# Patient Record
Sex: Female | Born: 1975 | Race: Asian | Hispanic: No | Smoking: Current every day smoker
Health system: Southern US, Community
[De-identification: ages and names within clinical notes are randomized; demographics above are authoritative.]

## PROBLEM LIST (undated history)

## (undated) DIAGNOSIS — E785 Hyperlipidemia, unspecified: Secondary | ICD-10-CM

## (undated) DIAGNOSIS — E119 Type 2 diabetes mellitus without complications: Secondary | ICD-10-CM

## (undated) HISTORY — PX: GYNECOLOGIC CRYOSURGERY: SHX857

## (undated) HISTORY — DX: Hyperlipidemia, unspecified: E78.5

## (undated) HISTORY — PX: ABDOMINAL HYSTERECTOMY: SHX81

## (undated) HISTORY — PX: MOUTH SURGERY: SHX715

## (undated) HISTORY — DX: Type 2 diabetes mellitus without complications: E11.9

---

## 2000-11-14 ENCOUNTER — Other Ambulatory Visit: Admission: RE | Admit: 2000-11-14 | Discharge: 2000-11-14 | Payer: Self-pay | Admitting: Obstetrics

## 2001-03-18 ENCOUNTER — Encounter: Admission: RE | Admit: 2001-03-18 | Discharge: 2001-06-16 | Payer: Self-pay | Admitting: Internal Medicine

## 2001-05-06 ENCOUNTER — Encounter (HOSPITAL_COMMUNITY): Admission: RE | Admit: 2001-05-06 | Discharge: 2001-05-14 | Payer: Self-pay | Admitting: *Deleted

## 2001-05-11 ENCOUNTER — Inpatient Hospital Stay (HOSPITAL_COMMUNITY): Admission: AD | Admit: 2001-05-11 | Discharge: 2001-05-11 | Payer: Self-pay | Admitting: Obstetrics

## 2001-05-19 ENCOUNTER — Inpatient Hospital Stay (HOSPITAL_COMMUNITY): Admission: AD | Admit: 2001-05-19 | Discharge: 2001-05-22 | Payer: Self-pay | Admitting: Obstetrics

## 2001-05-19 ENCOUNTER — Encounter (INDEPENDENT_AMBULATORY_CARE_PROVIDER_SITE_OTHER): Payer: Self-pay

## 2003-09-21 ENCOUNTER — Emergency Department (HOSPITAL_COMMUNITY): Admission: EM | Admit: 2003-09-21 | Discharge: 2003-09-21 | Payer: Self-pay | Admitting: Emergency Medicine

## 2005-02-14 ENCOUNTER — Emergency Department (HOSPITAL_COMMUNITY): Admission: EM | Admit: 2005-02-14 | Discharge: 2005-02-14 | Payer: Self-pay | Admitting: Emergency Medicine

## 2008-02-05 ENCOUNTER — Emergency Department (HOSPITAL_COMMUNITY): Admission: EM | Admit: 2008-02-05 | Discharge: 2008-02-05 | Payer: Self-pay | Admitting: Emergency Medicine

## 2011-02-16 NOTE — H&P (Signed)
[facility]  Patient:    Alyssa Williams, Alyssa Williams Visit Number: [id number] MRN: [id number]          Service Type: OBS Location: 910A 9147 01 Attending Physician:  Venita Sheffield Adm. Date:  [id number]                           History and Physical  HISTORY:                      The patient is a 35 year old, gravida 3, para 0-1-1-1, [facility] May 30, 2001. She had a previous classical C-section at 25 weeks because of preeclampsia and she is now admitted contracting. The cervix was closed, vertex -2. She also desired sterilization. She had a negative group B strep. She had gestational diabetes controlled on diet. All her sugars, four times a day, were normal. She has been followed with nonstress tests twice weekly for the past two weeks. In triage, her blood pressures are 153/99, 146/97, 144/82, and 121/63. PIH labs were ordered.  PHYSICAL EXAMINATION:  GENERAL:                      Obese female in early labor.  HEENT:                        Negative.  LUNGS:                        Clear.  HEART:                        Regular rate and rhythm, no murmurs, no gallops.  ABDOMEN:                      Term size uterus. Fetal heart 140.  PELVIC:                       As described above.  EXTREMITIES:                  Negative.   DD:  05/19/01 TD:  05/19/01 Job: [id number]

## 2011-02-16 NOTE — Discharge Summary (Signed)
[facility]  Patient:    Alyssa Williams, Alyssa Williams Visit Number: [id number] MRN: [id number]          Service Type: OBS Location: 910A 9147 01 Attending Physician:  Venita Sheffield Dictated by:   Kathreen Cosier, M.D. Adm. Date:  05/19/2001 Disc. Date: 05/22/2001                             Discharge Summary  HOSPITAL COURSE:              The patient is a 35 year old, gravida 3, para 0-1-1-1, [facility] May 30, 2001. She had a previous classical C-section at 25 weeks because of preeclampsia and she was now admitted in early labor. The cervix was closed, vertex -2. She also desired sterilization. On admission, the patient had blood pressures of 153/99, 146/97. PIH labs were normal. She had a spinal and low transverse cesarean section. She had a female, Apgars of8/9, weighing 7 pounds 10 ounces on May 19, 2001. On admission her hemoglobin was 12.7. Postoperative hemoglobin was 11.6. The patient did well. Her vital signs were normal and she was discharged on the third postoperative day, ambulatory, on a regular diet, status post repeat low transverse cesarean section and tubal ligation at term.  DISCHARGE FOLLOWUP:           The patient is to see Dr. Gaynell Face in six weeks. ictated by:   Kathreen Cosier, M.D. Attending Physician:  Venita Sheffield DD:  05/22/01 TD:  05/22/01 Job: [id number]

## 2011-02-16 NOTE — Op Note (Signed)
[facility]  Patient:    Alyssa Williams, Alyssa Williams Visit Number: [id number] MRN: [id number]          Service Type: ANT Location: [facility]C Attending Physician:  Venita Sheffield Dictated by:   Kathreen Cosier, M.D. Proc. Date: 05/19/01 Admit Date:  05/06/2001 Discharge Date: 05/14/2001                             Operative Report  PREOPERATIVE DIAGNOSES:       Previous classical cesarean section, at term, in                               early labor, desires repeat, multiparity, tubal                               ligation.  POSTOPERATIVE DIAGNOSES:      Previous classical cesarean section, at term, in                               early labor, desires repeat, multiparity, tubal                               ligation.  OPERATION:                    Repeat cesarean section and tubal ligation.  SURGEON:                      Kathreen Cosier, M.D.  ANESTHESIA:                   Spinal, by Dr. Arby Barrette.  ESTIMATED BLOOD LOSS:         400 cc.  DESCRIPTION OF PROCEDURE:     The patient was placed on the operating table in supine position after the spinal administered. The abdomen was prepped and draped. The bladder was emptied by a Foley catheter. A transverse suprapubic incision was made through the old scar and carried down to the rectus fascia. The fascia was cleanly incised the length of the incision. The rectus muscles were retracted laterally and the peritoneum incised longitudinally. A transverse incision was made in the visceral peritoneum above the bladder and the bladder mobilized inferiorly. A transverse lower uterine incision was made. The fluid was slightly meconium stained. The baby was DeLee prior to delivery of the shoulders. The baby was a female, Apgars 8/9, weighing 7 pounds 10 ounces. The placenta was posterior, removed manually. The uterine cavity was cleaned with dry laps. The uterine incision was closed in one layer with  continuous suture of #1 chromic. Hemostasis was satisfactory. The bladder flap was reattached with 2-0 chromic. The uterus was well contracted. Tubes and ovaries were normal. The right tube was grasped in the mid portion by Babcock clamp. A 0 plain suture was placed in the mesosalpinx below the portion of tube in the clamp. This was tied and approximately one inch of tube transected. Hemostasis was satisfactory. The procedure was done in a similar fashion on the other side. Lap and sponge counts were correct. The abdomen was closed in layers: the peritoneum with continuous suture of 0 chromic, the fascia with continuous suture of 0 Dexon,  and the skin closed with subcuticular stitch of 3-0 plain. The patient tolerated the procedure well and was taken to the recovery room in good condition. Dictated by:   Kathreen Cosier, M.D. Attending Physician:  Venita Sheffield DD:  05/19/01 TD:  05/20/01 Job: [id number]

## 2017-05-15 ENCOUNTER — Ambulatory Visit
Admission: RE | Admit: 2017-05-15 | Discharge: 2017-05-15 | Disposition: A | Discharge status: Home / Self Care | Payer: Self-pay | Source: Ambulatory Visit | Attending: Nurse Practitioner | Admitting: Nurse Practitioner

## 2017-05-15 ENCOUNTER — Other Ambulatory Visit: Payer: Self-pay | Admitting: Nurse Practitioner

## 2017-05-15 DIAGNOSIS — R05 Cough: Secondary | ICD-10-CM

## 2017-05-15 DIAGNOSIS — R059 Cough, unspecified: Secondary | ICD-10-CM

## 2017-05-28 ENCOUNTER — Encounter: Payer: Medicaid Other | Attending: Nurse Practitioner | Admitting: *Deleted

## 2017-05-28 ENCOUNTER — Encounter: Payer: Self-pay | Admitting: *Deleted

## 2017-05-28 VITALS — BP 112/70 | Ht 62.0 in | Wt 174.7 lb

## 2017-05-28 DIAGNOSIS — Z713 Dietary counseling and surveillance: Secondary | ICD-10-CM | POA: Insufficient documentation

## 2017-05-28 DIAGNOSIS — E119 Type 2 diabetes mellitus without complications: Secondary | ICD-10-CM | POA: Insufficient documentation

## 2017-05-28 DIAGNOSIS — E669 Obesity, unspecified: Secondary | ICD-10-CM | POA: Insufficient documentation

## 2017-05-28 DIAGNOSIS — E1165 Type 2 diabetes mellitus with hyperglycemia: Secondary | ICD-10-CM

## 2017-05-28 NOTE — Progress Notes (Signed)
Diabetes Self-Management Education  Visit Type: First/Initial  Appt. Start Time: 1035 Appt. End Time: 1200  05/28/2017  [name], identified by name and date of birth, is a 41 y.o. female with a diagnosis of Diabetes: Type 2.   ASSESSMENT  Blood pressure 112/70, height 5\' 2"  (1.575 m), weight 174 lb 11.2 oz (79.2 kg). Body mass index is 31.95 kg/m.      Diabetes Self-Management Education - 05/28/17 1508      Visit Information   Visit Type First/Initial     Initial Visit   Diabetes Type Type 2   Are you currently following a meal plan? Yes   What type of meal plan do you follow? "avoid sugars and cutting breads, pastas, rice to minimum"   Are you taking your medications as prescribed? Yes  She did forget to take her insulin last night.    Date Diagnosed August 2018     Health Coping   How would you rate your overall health? Fair     Psychosocial Assessment   Patient Belief/Attitude about Diabetes Other (comment)  "depressed, sad, unhappy"   Self-care barriers None   Self-management support Doctor's office   Patient Concerns Nutrition/Meal planning;Medication;Monitoring;Healthy Lifestyle;Glycemic Control;Problem Solving;Weight Control   Special Needs None   Preferred Learning Style Visual;Hands on   Learning Readiness Change in progress   How often do you need to have someone help you when you read instructions, pamphlets, or other written materials from your doctor or pharmacy? 1 - Never   What is the last grade level you completed in school? college     Pre-Education Assessment   Patient understands the diabetes disease and treatment process. Needs Instruction   Patient understands incorporating nutritional management into lifestyle. Needs Instruction   Patient undertands incorporating physical activity into lifestyle. Needs Instruction   Patient understands using medications safely. Needs Instruction   Patient understands monitoring blood glucose,  interpreting and using results Needs Review   Patient understands prevention, detection, and treatment of acute complications. Needs Instruction   Patient understands prevention, detection, and treatment of chronic complications. Needs Instruction   Patient understands how to develop strategies to address psychosocial issues. Needs Instruction   Patient understands how to develop strategies to promote health/change behavior. Needs Instruction     Complications   Last HgB A1C per patient/outside source 12.1 %  05/15/17   How often do you check your blood sugar? 1-2 times/day  Provided Accu-Chek Guide meter as preferred with Medicaid and instructed on use. BG upon return demonstration was 453 mg/dL at 65:78 am - pt had Nectarine and diet soda before visit. Repeat BG on office meter was 406 mg/dL. 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    Fasting Blood glucose range (mg/dL) >469  Pt reports FBG's 200's mg/dL.    Have you had a dilated eye exam in the past 12 months? No   Have you had a dental exam in the past 12 months? No   Are you checking your feet? Yes   How many days per week are you checking your feet? 7     Dietary Intake   Breakfast fruit, grits, porridge from rice and eggs, oatmeal, toast with peanut butter   Lunch left overs   Snack (afternoon) nuts, popcorn, sunflower seeds   Dinner chicken, fish, pork with rice, cuccumbers, tomatoes, tuna fish lettuce wraps   Beverage(s) water, diet Ginger Ale, fruit juices  Exercise   Exercise Type Light (walking / raking leaves)   How many days per week to you exercise? 3   How many minutes per day do you exercise? 30   Total minutes per week of exercise 90     Patient Education   Previous Diabetes Education Yes (please comment)  Pt had Gestational Diabetes 15 years ago   Disease state  Definition of diabetes, type 1 and 2, and the diagnosis of diabetes;Factors that contribute  to the development of diabetes   Nutrition management  Role of diet in the treatment of diabetes and the relationship between the three main macronutrients and blood glucose level;Reviewed blood glucose goals for pre and post meals and how to evaluate the patients' food intake on their blood glucose level.   Physical activity and exercise  Role of exercise on diabetes management, blood pressure control and cardiac health.   Medications Taught/reviewed insulin injection, site rotation, insulin storage and needle disposal.;Reviewed patients medication for diabetes, action, purpose, timing of dose and side effects.   Monitoring Taught/evaluated SMBG meter.;Purpose and frequency of SMBG.;Taught/discussed recording of test results and interpretation of SMBG.;Identified appropriate SMBG and/or A1C goals.   Acute complications Taught treatment of hypoglycemia - the 15 rule.   Chronic complications Relationship between chronic complications and blood glucose control;Retinopathy and reason for yearly dilated eye exams   Psychosocial adjustment Role of stress on diabetes;Identified and addressed patients feelings and concerns about diabetes  Pt reports stress related to children.   Personal strategies to promote health Review risk of smoking and offered smoking cessation     Individualized Goals (developed by patient)   Reducing Risk Improve blood sugars Decrease medications Prevent diabetes complications Lose weight Lead a healthier lifestyle Become more fit     Outcomes   Expected Outcomes Demonstrated interest in learning. Expect positive outcomes   Future DMSE 4-6 wks      Individualized Plan for Diabetes Self-Management Training:   Learning Objective:  Patient will have a greater understanding of diabetes self-management. Patient education plan is to attend individual and/or group sessions per assessed needs and concerns.   Plan:   Patient Instructions  Check blood sugars 2 x day before  breakfast and 2 hrs after supper every day Bring blood sugar records to the next class Call your doctor for a prescription for:  1. Meter strips (type) Accu-Chek Guide  checking  2 times per day  2. Lancets (type) Accu-Chek FastClix checking  2     times per day Exercise: Continue walking  for   30  minutes  3 days a week and gradually increase to 30 minutes 5 x week  Nothing strenuous until blood sugars less than 250 Eat 3 meals day,   1-2  snacks a day Space meals 4-6 hours apart Avoid sugar sweetened drinks (juices) Quit smoking Make an eye doctor appointment once blood sugars stable Carry fast acting glucose and a snack at all times Rotate injection sites   Expected Outcomes:  Demonstrated interest in learning. Expect positive outcomes  Education material provided:  General Meal Planning Guidelines Simple Meal Plan Meter = Accu-Chek Guide Glucose tablets Symptoms, causes and treatments of Hypoglycemia  If problems or questions, patient to contact team via:  Sharion Settler, RN, CCM, CDE [phone]  Future DSME appointment: 4-6 wks  June 24, 2017 for Diabetes Class 1

## 2017-05-28 NOTE — Patient Instructions (Signed)
Check blood sugars 2 x day before breakfast and 2 hrs after supper every day Bring blood sugar records to the next class  Call your doctor for a prescription for:  1. Meter strips (type) Accu-Chek Guide  checking  2 times per day  2. Lancets (type) Accu-Chek FastClix checking  2     times per day  Exercise: Continue walking  for   30  minutes  3 days a week and gradually increase to 30 minutes 5 x week  Nothing strenuous until blood sugars less than 250  Eat 3 meals day,   1-2  snacks a day Space meals 4-6 hours apart Avoid sugar sweetened drinks (juices)  Quit smoking  Make an eye doctor appointment once blood sugars stable  Carry fast acting glucose and a snack at all times Rotate injection sites  Return for classes on:

## 2017-06-24 ENCOUNTER — Encounter: Payer: Self-pay | Admitting: Dietician

## 2017-06-24 NOTE — Progress Notes (Signed)
Pt cancelled diabetes class today; called pt and she wants to  check her work schedule and she will call us back to reschedule classes

## 2017-07-15 ENCOUNTER — Ambulatory Visit: Payer: Medicaid Other

## 2017-07-29 ENCOUNTER — Encounter: Payer: Self-pay | Admitting: *Deleted

## 2018-04-11 ENCOUNTER — Emergency Department: Payer: Medicaid Other

## 2018-04-11 ENCOUNTER — Encounter: Payer: Self-pay | Admitting: Emergency Medicine

## 2018-04-11 ENCOUNTER — Other Ambulatory Visit: Payer: Self-pay

## 2018-04-11 ENCOUNTER — Emergency Department
Admission: EM | Admit: 2018-04-11 | Discharge: 2018-04-11 | Disposition: A | Discharge status: Home / Self Care | Payer: Medicaid Other | Attending: Emergency Medicine | Admitting: Emergency Medicine

## 2018-04-11 DIAGNOSIS — F17210 Nicotine dependence, cigarettes, uncomplicated: Secondary | ICD-10-CM | POA: Insufficient documentation

## 2018-04-11 DIAGNOSIS — R319 Hematuria, unspecified: Secondary | ICD-10-CM | POA: Insufficient documentation

## 2018-04-11 DIAGNOSIS — Z794 Long term (current) use of insulin: Secondary | ICD-10-CM | POA: Insufficient documentation

## 2018-04-11 DIAGNOSIS — Z79899 Other long term (current) drug therapy: Secondary | ICD-10-CM | POA: Insufficient documentation

## 2018-04-11 DIAGNOSIS — E119 Type 2 diabetes mellitus without complications: Secondary | ICD-10-CM | POA: Insufficient documentation

## 2018-04-11 DIAGNOSIS — R109 Unspecified abdominal pain: Secondary | ICD-10-CM | POA: Insufficient documentation

## 2018-04-11 DIAGNOSIS — Z7982 Long term (current) use of aspirin: Secondary | ICD-10-CM | POA: Insufficient documentation

## 2018-04-11 DIAGNOSIS — R110 Nausea: Secondary | ICD-10-CM | POA: Insufficient documentation

## 2018-04-11 LAB — COMPREHENSIVE METABOLIC PANEL
ALT: 20 U/L (ref 0–44)
AST: 19 U/L (ref 15–41)
Albumin: 4.1 g/dL (ref 3.5–5.0)
Alkaline Phosphatase: 100 U/L (ref 38–126)
Anion gap: 11 (ref 5–15)
BUN: 20 mg/dL (ref 6–20)
CO2: 24 mmol/L (ref 22–32)
Calcium: 9.5 mg/dL (ref 8.9–10.3)
Chloride: 100 mmol/L (ref 98–111)
Creatinine, Ser: 0.77 mg/dL (ref 0.44–1.00)
GFR calc Af Amer: >60 mL/min (ref >60)
GFR calc non Af Amer: >60 mL/min (ref >60)
Glucose, Bld: 335 mg/dL — ABNORMAL HIGH (ref 70–99)
Potassium: 4 mmol/L (ref 3.5–5.1)
Sodium: 135 mmol/L (ref 135–145)
Total Bilirubin: 0.8 mg/dL (ref 0.3–1.2)
Total Protein: 7.7 g/dL (ref 6.5–8.1)

## 2018-04-11 LAB — LIPASE, BLOOD: Lipase: 41 U/L (ref 11–51)

## 2018-04-11 LAB — URINALYSIS, COMPLETE (UACMP) WITH MICROSCOPIC
Bacteria, UA: NONE SEEN
Bilirubin Urine: NEGATIVE
Glucose, UA: >=500 mg/dL — AB
Hgb urine dipstick: NEGATIVE
Ketones, ur: NEGATIVE mg/dL
Nitrite: NEGATIVE
Protein, ur: NEGATIVE mg/dL
Specific Gravity, Urine: 1.025 (ref 1.005–1.030)
pH: 5 (ref 5.0–8.0)

## 2018-04-11 LAB — CBC
HCT: 42.2 % (ref 35.0–47.0)
Hemoglobin: 14.9 g/dL (ref 12.0–16.0)
MCH: 30.7 pg (ref 26.0–34.0)
MCHC: 35.4 g/dL (ref 32.0–36.0)
MCV: 86.7 fL (ref 80.0–100.0)
Platelets: 357 10*3/uL (ref 150–440)
RBC: 4.87 MIL/uL (ref 3.80–5.20)
RDW: 12.5 % (ref 11.5–14.5)
WBC: 8.5 10*3/uL (ref 3.6–11.0)

## 2018-04-11 MED ORDER — IBUPROFEN 600 MG PO TABS: 600.0000 mg | ORAL_TABLET | Freq: Three times a day (TID) | ORAL | 0 refills | Status: AC | PRN

## 2018-04-11 MED ORDER — KETOROLAC TROMETHAMINE 30 MG/ML IJ SOLN
30.0000 mg | Freq: Once | INTRAMUSCULAR | Status: AC
  Administered 2018-04-11: 30 mg via INTRAMUSCULAR
  Filled 2018-04-11: qty 1

## 2018-04-11 MED ORDER — HYDROCODONE-ACETAMINOPHEN 5-325 MG PO TABS: 1.0000 | ORAL_TABLET | Freq: Four times a day (QID) | ORAL | 0 refills | Status: DC | PRN

## 2018-04-11 MED ORDER — HYDROCODONE-ACETAMINOPHEN 5-325 MG PO TABS
1.0000 | ORAL_TABLET | Freq: Once | ORAL | Status: AC
  Administered 2018-04-11: 1 via ORAL
  Filled 2018-04-11: qty 1

## 2018-04-11 MED ORDER — ONDANSETRON 4 MG PO TBDP: 4.0000 mg | ORAL_TABLET | Freq: Three times a day (TID) | ORAL | 0 refills | Status: AC | PRN

## 2018-04-11 NOTE — ED Triage Notes (Signed)
C/O lower abdominal pain and left flank pain x 2 days.. Pain worsened today.  Patient had urine checked at PCP which showed blood in urine.  Sent to ED for evaluation for kidney stone.

## 2018-04-11 NOTE — Discharge Instructions (Signed)
Fortunately today your blood work, your urinalysis, and your CT scan were reassuring.  It is still possible to have kidney stones that are too small to show up on CT scan.  Please take your pain medication as needed and return to the emergency department for any concerns particularly if you develop a fever of any kind.  It was a pleasure to take care of you today, and thank you for coming to our emergency department.  If you have any questions or concerns before leaving please ask the nurse to grab me and I'm more than happy to go through your aftercare instructions again.  If you were prescribed any opioid pain medication today such as Norco, Vicodin, Percocet, morphine, hydrocodone, or oxycodone please make sure you do not drive when you are taking this medication as it can alter your ability to drive safely.  If you have any concerns once you are home that you are not improving or are in fact getting worse before you can make it to your follow-up appointment, please do not hesitate to call [phone] and come back for further evaluation.  Merrily Brittle, MD  Results for orders placed or performed during the hospital encounter of 04/11/18  Lipase, blood  Result Value Ref Range   Lipase 41 11 - 51 U/L  Comprehensive metabolic panel  Result Value Ref Range   Sodium 135 135 - 145 mmol/L   Potassium 4.0 3.5 - 5.1 mmol/L   Chloride 100 98 - 111 mmol/L   CO2 24 22 - 32 mmol/L   Glucose, Bld 335 (H) 70 - 99 mg/dL   BUN 20 6 - 20 mg/dL   Creatinine, Ser 1.61 0.44 - 1.00 mg/dL   Calcium 9.5 8.9 - 09.6 mg/dL   Total Protein 7.7 6.5 - 8.1 g/dL   Albumin 4.1 3.5 - 5.0 g/dL   AST 19 15 - 41 U/L   ALT 20 0 - 44 U/L   Alkaline Phosphatase 100 38 - 126 U/L   Total Bilirubin 0.8 0.3 - 1.2 mg/dL   GFR calc non Af Amer >60 >60 mL/min   GFR calc Af Amer >60 >60 mL/min   Anion gap 11 5 - 15  CBC  Result Value Ref Range   WBC 8.5 3.6 - 11.0 K/uL   RBC 4.87 3.80 - 5.20 MIL/uL   Hemoglobin 14.9 12.0 - 16.0  g/dL   HCT 04.5 40.9 - 81.1 %   MCV 86.7 80.0 - 100.0 fL   MCH 30.7 26.0 - 34.0 pg   MCHC 35.4 32.0 - 36.0 g/dL   RDW 91.4 78.2 - 95.6 %   Platelets 357 150 - 440 K/uL  Urinalysis, Complete w Microscopic  Result Value Ref Range   Color, Urine STRAW (A) YELLOW   APPearance CLEAR (A) CLEAR   Specific Gravity, Urine 1.025 1.005 - 1.030   pH 5.0 5.0 - 8.0   Glucose, UA >=500 (A) NEGATIVE mg/dL   Hgb urine dipstick NEGATIVE NEGATIVE   Bilirubin Urine NEGATIVE NEGATIVE   Ketones, ur NEGATIVE NEGATIVE mg/dL   Protein, ur NEGATIVE NEGATIVE mg/dL   Nitrite NEGATIVE NEGATIVE   Leukocytes, UA TRACE (A) NEGATIVE   RBC / HPF 0-5 0 - 5 RBC/hpf   WBC, UA 6-10 0 - 5 WBC/hpf   Bacteria, UA NONE SEEN NONE SEEN   Squamous Epithelial / LPF 6-10 0 - 5   Ct Renal Stone Study  Result Date: 04/11/2018 CLINICAL DATA:  LEFT lower abdominal pain and LEFT  flank pain for 2 days. Blood in urine. EXAM: CT ABDOMEN AND PELVIS WITHOUT CONTRAST TECHNIQUE: Multidetector CT imaging of the abdomen and pelvis was performed following the standard protocol without IV contrast. COMPARISON:  None. FINDINGS: Lower chest: No acute abnormality. Hepatobiliary: No focal liver abnormality is seen. No gallstones, gallbladder wall thickening, or biliary dilatation. Pancreas: Unremarkable. No pancreatic ductal dilatation or surrounding inflammatory changes. Spleen: Normal in size without focal abnormality. Adrenals/Urinary Tract: Kidneys are unremarkable without mass, stone or hydronephrosis. No perinephric inflammation. No ureteral or bladder calculi identified. Bladder appears normal. Multiple calcified phleboliths within the lower pelvis. Stomach/Bowel: Bowel is normal in caliber. No bowel wall thickening or evidence of bowel wall inflammation. Appendix is normal. Stomach is unremarkable, partially decompressed. Vascular/Lymphatic: Aortic atherosclerosis. No enlarged abdominal or pelvic lymph nodes. Reproductive: Presumed hysterectomy.   No adnexal mass or free fluid. Other: No free fluid or abscess collection. No free intraperitoneal air. Musculoskeletal: No acute or suspicious osseous finding. Degenerative spondylitic changes at the lumbosacral junction, moderate in degree with osseous neural foramen narrowings. IMPRESSION: 1. No acute findings within the abdomen or pelvis. No renal or ureteral calculi identified. No hydronephrosis or perinephric inflammation. Bladder appears normal. No bowel obstruction or evidence of bowel wall inflammation. 2. Aortic atherosclerosis. 3. Degenerative disc disease at L5-S1, at least moderate in degree, with associated neural foramen narrowings bilaterally, RIGHT greater than LEFT. If any chronic low back pain or associated lower extremity radiculopathy, would consider nonemergent lumbar spine MRI to exclude associated nerve root impingement. Electronically Signed   By: Bary RichardStan  Maynard M.D.   On: 04/11/2018 16:24

## 2018-04-11 NOTE — ED Provider Notes (Signed)
[facility] Emergency Department Provider Note  ____________________________________________   First MD Initiated Contact with Patient 04/11/18 1601     (approximate)  I have reviewed the triage vital signs and the nursing notes.   HISTORY  Chief Complaint Abdominal Pain    HPI Alyssa Williams is a 42 y.o. female who self presents to the emergency department with lower abdominal and left flank pain for the past 2 days.  The patient is a Engineer, sitemedical assistant and today at work she had her urine dipped and there was blood in it so she was advised to come to the hospital for evaluation for renal colic.  The patient's pain was sudden onset moderate severity.  Nothing seems to make it better or worse.  She denies fevers or chills.  She denies dysuria frequency or hesitancy.  Nothing seems to make the symptoms better or worse.  She does have a history of poorly controlled diabetes mellitus.  She has never had a kidney stone before.  She does have a remote history of hysterectomy.    Past Medical History:  Diagnosis Date  . Diabetes mellitus without complication (HCC)   . Hyperlipidemia     There are no active problems to display for this patient.   Past Surgical History:  Procedure Laterality Date  . ABDOMINAL HYSTERECTOMY    . CESAREAN SECTION    . GYNECOLOGIC CRYOSURGERY    . MOUTH SURGERY      Prior to Admission medications   Medication Sig Start Date End Date Taking? Authorizing Provider  aspirin EC 81 MG tablet Take 81 mg by mouth daily.    [provider]  Cholecalciferol (PA VITAMIN D-3 GUMMY PO) Take 1 tablet by mouth daily.    [provider]  CINNAMON PO Take 1 capsule by mouth daily.    [provider]  Ginkgo Biloba 40 MG TABS Take 1 tablet by mouth daily.    [provider]  guaiFENesin (ROBITUSSIN) 100 MG/5ML SOLN Take 10 mLs by mouth every 4 (four) hours as needed for cough or to loosen phlegm.    [provider]  HYDROcodone-acetaminophen (NORCO) 5-325 MG tablet Take 1 tablet by mouth every 6 (six) hours as needed for up to 7 doses for severe pain. 04/11/18   Merrily Brittleifenbark, Jaely Silman, MD  ibuprofen (ADVIL,MOTRIN) 600 MG tablet Take 1 tablet (600 mg total) by mouth every 8 (eight) hours as needed. 04/11/18   Merrily Brittleifenbark, Aleria Maheu, MD  Insulin Glargine (TOUJEO SOLOSTAR) 300 UNIT/ML SOPN Inject 10 Units into the skin daily.    [provider]  metFORMIN (GLUMETZA) 500 MG (MOD) 24 hr tablet Take 500 mg by mouth 2 (two) times daily with a meal.    [provider]  MILK THISTLE PO Take 1 capsule by mouth daily.    [provider]  Multiple Vitamins-Minerals (WOMENS MULTI PO) Take 1 tablet by mouth daily.    [provider]  ondansetron (ZOFRAN ODT) 4 MG disintegrating tablet Take 1 tablet (4 mg total) by mouth every 8 (eight) hours as needed for nausea or vomiting. 04/11/18   Merrily Brittleifenbark, Marzetta Lanza, MD  POTASSIUM GLUCONATE PO Take 1 tablet by mouth daily.    [provider]    Allergies Demerol [meperidine] and Milk-related compounds  Family History  Problem Relation Age of Onset  . Diabetes Sister   . Diabetes Sister     Social History Social History   Tobacco Use  . Smoking status: Current Every  Day Smoker    Packs/day: 0.25    Years: 10.00    Pack years: 2.50    Types: Cigarettes  . Smokeless tobacco: Never Used  Substance Use Topics  . Alcohol use: Yes    Comment: occasionally - holidays  . Drug use: Not on file    Review of Systems Constitutional: No fever/chills Eyes: No visual changes. ENT: No sore throat. Cardiovascular: Denies chest pain. Respiratory: Denies shortness of breath. Gastrointestinal: Positive for abdominal pain.  Positive for nausea, no vomiting.  No diarrhea.  No constipation. Genitourinary: Negative for dysuria. Musculoskeletal: Positive for back pain. Skin: Negative for rash. Neurological: Negative for headaches, focal  weakness or numbness.   ____________________________________________   PHYSICAL EXAM:  VITAL SIGNS: ED Triage Vitals [04/11/18 1359]  Enc Vitals Group     BP 137/83     Pulse Rate 92     Resp 16     Temp 97.8 F (36.6 C)     Temp Source Oral     SpO2 100 %     Weight 168 lb (76.2 kg)     Height 5\' 2"  (1.575 m)     Head Circumference      Peak Flow      Pain Score 8     Pain Loc      Pain Edu?      Excl. in GC?     Constitutional: Alert and oriented x4 sitting up holding her left flank obviously uncomfortable Eyes: PERRL EOMI. Head: Atraumatic. Nose: No congestion/rhinnorhea. Mouth/Throat: No trismus Neck: No stridor.   Cardiovascular: Normal rate, regular rhythm. Grossly normal heart sounds.  Good peripheral circulation. Respiratory: Normal respiratory effort.  No retractions. Lungs CTAB and moving good air Gastrointestinal: Soft nontender no peritonitis no costovertebral tenderness Musculoskeletal: No lower extremity edema   Neurologic:  Normal speech and language. No gross focal neurologic deficits are appreciated. Skin:  Skin is warm, dry and intact. No rash noted. Psychiatric: Mood and affect are normal. Speech and behavior are normal.    ____________________________________________   DIFFERENTIAL includes but not limited to  Urinary tract infection, pyelonephritis, nephrolithiasis ____________________________________________   LABS (all labs ordered are listed, but only abnormal results are displayed)  Labs Reviewed  COMPREHENSIVE METABOLIC PANEL - Abnormal; Notable for the following components:      Result Value   Glucose, Bld 335 (*)    All other components within normal limits  URINALYSIS, COMPLETE (UACMP) WITH MICROSCOPIC - Abnormal; Notable for the following components:   Color, Urine STRAW (*)    APPearance CLEAR (*)    Glucose, UA >=500 (*)    Leukocytes, UA TRACE (*)    All other components within normal limits  LIPASE, BLOOD  CBC     Lab work reviewed by me with sugar in her urine otherwise unremarkable __________________________________________  EKG   ____________________________________________  RADIOLOGY  CT stone reviewed by me with no acute disease ____________________________________________   PROCEDURES  Procedure(s) performed: no  Procedures  Critical Care performed: no  ____________________________________________   INITIAL IMPRESSION / ASSESSMENT AND PLAN / ED COURSE  Pertinent labs & imaging results that were available during my care of the patient were reviewed by me and considered in my medical decision making (see chart for details).   The patient arrives obviously uncomfortable appearing with left flank pain and hematuria.  No history of previous renal colic so I will give her IM Toradol as well as Percocet and CT of her abdomen.  The  patient feels significantly improved.  CT scan is negative and I discussed with the patient the possibility of a small stone that was not picked up on CT.  Strict return precautions have been given and she is discharged home in improved condition.      ____________________________________________   FINAL CLINICAL IMPRESSION(S) / ED DIAGNOSES  Final diagnoses:  Flank pain      NEW MEDICATIONS STARTED DURING THIS VISIT:  Discharge Medication List as of 04/11/2018  4:41 PM    START taking these medications   Details  HYDROcodone-acetaminophen (NORCO) 5-325 MG tablet Take 1 tablet by mouth every 6 (six) hours as needed for up to 7 doses for severe pain., Starting Fri 04/11/2018, Print    ibuprofen (ADVIL,MOTRIN) 600 MG tablet Take 1 tablet (600 mg total) by mouth every 8 (eight) hours as needed., Starting Fri 04/11/2018, Print    ondansetron (ZOFRAN ODT) 4 MG disintegrating tablet Take 1 tablet (4 mg total) by mouth every 8 (eight) hours as needed for nausea or vomiting., Starting Fri 04/11/2018, Print         Note:  This document was  prepared using Dragon voice recognition software and may include unintentional dictation errors.     Merrily Brittle, MD 04/12/18 [phone]

## 2018-04-16 ENCOUNTER — Emergency Department
Admission: EM | Admit: 2018-04-16 | Discharge: 2018-04-16 | Disposition: A | Discharge status: Home / Self Care | Payer: Medicaid Other | Attending: Emergency Medicine | Admitting: Emergency Medicine

## 2018-04-16 ENCOUNTER — Encounter: Payer: Self-pay | Admitting: Emergency Medicine

## 2018-04-16 ENCOUNTER — Other Ambulatory Visit: Payer: Self-pay

## 2018-04-16 ENCOUNTER — Emergency Department: Payer: Medicaid Other

## 2018-04-16 DIAGNOSIS — R109 Unspecified abdominal pain: Secondary | ICD-10-CM

## 2018-04-16 DIAGNOSIS — Z794 Long term (current) use of insulin: Secondary | ICD-10-CM | POA: Insufficient documentation

## 2018-04-16 DIAGNOSIS — Z7982 Long term (current) use of aspirin: Secondary | ICD-10-CM | POA: Insufficient documentation

## 2018-04-16 DIAGNOSIS — E119 Type 2 diabetes mellitus without complications: Secondary | ICD-10-CM | POA: Insufficient documentation

## 2018-04-16 DIAGNOSIS — F17210 Nicotine dependence, cigarettes, uncomplicated: Secondary | ICD-10-CM | POA: Insufficient documentation

## 2018-04-16 DIAGNOSIS — Z79899 Other long term (current) drug therapy: Secondary | ICD-10-CM | POA: Insufficient documentation

## 2018-04-16 DIAGNOSIS — N390 Urinary tract infection, site not specified: Secondary | ICD-10-CM

## 2018-04-16 LAB — CBC
HCT: 42.1 % (ref 35.0–47.0)
Hemoglobin: 14.8 g/dL (ref 12.0–16.0)
MCH: 30 pg (ref 26.0–34.0)
MCHC: 35.3 g/dL (ref 32.0–36.0)
MCV: 85.1 fL (ref 80.0–100.0)
Platelets: 346 10*3/uL (ref 150–440)
RBC: 4.94 MIL/uL (ref 3.80–5.20)
RDW: 12.2 % (ref 11.5–14.5)
WBC: 10.1 10*3/uL (ref 3.6–11.0)

## 2018-04-16 LAB — URINALYSIS, COMPLETE (UACMP) WITH MICROSCOPIC
Glucose, UA: >=500 mg/dL — AB
Hgb urine dipstick: NEGATIVE
Ketones, ur: 5 mg/dL — AB
Nitrite: NEGATIVE
Protein, ur: 100 mg/dL — AB
Specific Gravity, Urine: 1.031 — ABNORMAL HIGH (ref 1.005–1.030)
WBC, UA: >50 WBC/hpf — ABNORMAL HIGH (ref 0–5)
pH: 5 (ref 5.0–8.0)

## 2018-04-16 LAB — COMPREHENSIVE METABOLIC PANEL
ALT: 20 U/L (ref 0–44)
AST: 17 U/L (ref 15–41)
Albumin: 4.2 g/dL (ref 3.5–5.0)
Alkaline Phosphatase: 109 U/L (ref 38–126)
Anion gap: 10 (ref 5–15)
BUN: 18 mg/dL (ref 6–20)
CO2: 25 mmol/L (ref 22–32)
Calcium: 9.9 mg/dL (ref 8.9–10.3)
Chloride: 102 mmol/L (ref 98–111)
Creatinine, Ser: 0.88 mg/dL (ref 0.44–1.00)
GFR calc Af Amer: >60 mL/min (ref >60)
GFR calc non Af Amer: >60 mL/min (ref >60)
Glucose, Bld: 307 mg/dL — ABNORMAL HIGH (ref 70–99)
Potassium: 3.9 mmol/L (ref 3.5–5.1)
Sodium: 137 mmol/L (ref 135–145)
Total Bilirubin: 0.7 mg/dL (ref 0.3–1.2)
Total Protein: 7.5 g/dL (ref 6.5–8.1)

## 2018-04-16 LAB — LIPASE, BLOOD: Lipase: 47 U/L (ref 11–51)

## 2018-04-16 MED ORDER — SODIUM CHLORIDE 0.9 % IV SOLN
1.0000 g | INTRAVENOUS | Status: AC
  Administered 2018-04-16: 1 g via INTRAVENOUS
  Filled 2018-04-16: qty 10

## 2018-04-16 MED ORDER — OXYCODONE-ACETAMINOPHEN 5-325 MG PO TABS
2.0000 | ORAL_TABLET | Freq: Once | ORAL | Status: AC
  Administered 2018-04-16: 2 via ORAL
  Filled 2018-04-16: qty 2

## 2018-04-16 MED ORDER — HYDROCODONE-ACETAMINOPHEN 5-325 MG PO TABS: 1.0000 | ORAL_TABLET | Freq: Four times a day (QID) | ORAL | 0 refills | Status: AC | PRN

## 2018-04-16 MED ORDER — SODIUM CHLORIDE 0.9 % IV SOLN
INTRAVENOUS | Status: AC
  Filled 2018-04-16: qty 10

## 2018-04-16 MED ORDER — SODIUM CHLORIDE 0.9 % IV BOLUS
1000.0000 mL | Freq: Once | INTRAVENOUS | Status: AC
Start: 2018-04-16 — End: 2018-04-16
  Administered 2018-04-16: 1000 mL via INTRAVENOUS

## 2018-04-16 MED ORDER — FLUCONAZOLE 150 MG PO TABS: 150.0000 mg | ORAL_TABLET | Freq: Once | ORAL | 0 refills | Status: AC

## 2018-04-16 MED ORDER — KETOROLAC TROMETHAMINE 30 MG/ML IJ SOLN
15.0000 mg | Freq: Once | INTRAMUSCULAR | Status: AC
  Administered 2018-04-16: 15 mg via INTRAVENOUS
  Filled 2018-04-16: qty 1

## 2018-04-16 MED ORDER — MORPHINE SULFATE (PF) 4 MG/ML IV SOLN
4.0000 mg | Freq: Once | INTRAVENOUS | Status: AC
  Administered 2018-04-16: 4 mg via INTRAVENOUS

## 2018-04-16 MED ORDER — DOCUSATE SODIUM 100 MG PO CAPS: ORAL_CAPSULE | ORAL | 0 refills | Status: AC

## 2018-04-16 MED ORDER — ONDANSETRON HCL 4 MG/2ML IJ SOLN
INTRAMUSCULAR | Status: AC
  Filled 2018-04-16: qty 2

## 2018-04-16 MED ORDER — CEPHALEXIN 500 MG PO CAPS: 500.0000 mg | ORAL_CAPSULE | Freq: Three times a day (TID) | ORAL | 0 refills | Status: AC

## 2018-04-16 MED ORDER — MORPHINE SULFATE (PF) 4 MG/ML IV SOLN
INTRAVENOUS | Status: AC
  Administered 2018-04-16: 4 mg via INTRAVENOUS
  Filled 2018-04-16: qty 1

## 2018-04-16 MED ORDER — ONDANSETRON HCL 4 MG/2ML IJ SOLN
4.0000 mg | INTRAMUSCULAR | Status: AC
  Administered 2018-04-16: 4 mg via INTRAVENOUS

## 2018-04-16 MED ORDER — IOHEXOL 300 MG/ML  SOLN
100.0000 mL | Freq: Once | INTRAMUSCULAR | Status: AC | PRN
  Administered 2018-04-16: 100 mL via INTRAVENOUS
  Filled 2018-04-16: qty 100

## 2018-04-16 NOTE — Discharge Instructions (Signed)
As we discussed, we are treating you for pyelonephritis (infection of the kidney) given the fact that you have a urinary tract infection and left flank pain, but fortunately your lab results and CT scan were all reassuring.  Please take your prescribed antibiotics as recommended for the full course of treatment.  Take over-the-counter ibuprofen and/or Tylenol according to label instructions for pain.  Take Norco as prescribed for severe pain. Do not drink alcohol, drive or participate in any other potentially dangerous activities while taking this medication as it may make you sleepy. Do not take this medication with any other sedating medications, either prescription or over-the-counter. If you were prescribed Percocet or Vicodin, do not take these with acetaminophen (Tylenol) as it is already contained within these medications.   This medication is an opiate (or narcotic) pain medication and can be habit forming.  Use it as little as possible to achieve adequate pain control.  Do not use or use it with extreme caution if you have a history of opiate abuse or dependence.  If you are on a pain contract with your primary care doctor or a pain specialist, be sure to let them know you were prescribed this medication today from the [facility].  This medication is intended for your use only - do not give any to anyone else and keep it in a secure place where nobody else, especially children, have access to it.  It will also cause or worsen constipation, so you may want to consider taking an over-the-counter stool softener while you are taking this medication.    Return to the emergency department if you develop new or worsening symptoms that concern you.

## 2018-04-16 NOTE — ED Provider Notes (Signed)
[facility] Emergency Department Provider Note  ____________________________________________   First MD Initiated Contact with Patient 04/16/18 1954     (approximate)  I have reviewed the triage vital signs and the nursing notes.   HISTORY  Chief Complaint Abdominal Pain    HPI Alyssa Williams is a 42 y.o. female with medical history as listed below who presents for evaluation of persistent pain in her left flank radiating to her left lower quadrant.  She was seen in this emergency department 5 days ago for the same symptoms and had a negative CT renal stone protocol.  She was told that it could be stones that she had passed and she should start feeling better.  She reports that the pain has been persistent and is gradually gotten worse.  Today it was severe enough that she was unable to go to work and she had some nausea and vomiting.  She states that her hydrocodone are doing very little to relieve her pain.  She has had burning with urination and increased frequency as of the last day or 2.  Nothing particular makes her symptoms better and she cannot find a position of comfort.  Nothing particular makes her symptoms worse.  She says she had a fever of 99.9 earlier today.  Past Medical History:  Diagnosis Date  . Diabetes mellitus without complication (HCC)   . Hyperlipidemia     There are no active problems to display for this patient.   Past Surgical History:  Procedure Laterality Date  . ABDOMINAL HYSTERECTOMY    . CESAREAN SECTION    . GYNECOLOGIC CRYOSURGERY    . MOUTH SURGERY      Prior to Admission medications   Medication Sig Start Date End Date Taking? Authorizing Provider  aspirin EC 81 MG tablet Take 81 mg by mouth daily.    [provider]  cephALEXin (KEFLEX) 500 MG capsule Take 1 capsule (500 mg total) by mouth 3 (three) times daily. 04/16/18   Loleta Rose, MD  Cholecalciferol (PA VITAMIN D-3 GUMMY PO) Take 1 tablet by mouth  daily.    [provider]  CINNAMON PO Take 1 capsule by mouth daily.    [provider]  docusate sodium (COLACE) 100 MG capsule Take 1 tablet once or twice daily as needed for constipation while taking narcotic pain medicine 04/16/18   Loleta Rose, MD  fluconazole (DIFLUCAN) 150 MG tablet Take 1 tablet (150 mg total) by mouth once for 1 dose. If you still have symptoms after 48 hours, take the second pill by mouth. 04/16/18 04/16/18  Loleta Rose, MD  Ginkgo Biloba 40 MG TABS Take 1 tablet by mouth daily.    [provider]  guaiFENesin (ROBITUSSIN) 100 MG/5ML SOLN Take 10 mLs by mouth every 4 (four) hours as needed for cough or to loosen phlegm.    [provider]  HYDROcodone-acetaminophen (NORCO) 5-325 MG tablet Take 1 tablet by mouth every 6 (six) hours as needed for up to 7 doses for severe pain. 04/16/18   Loleta Rose, MD  ibuprofen (ADVIL,MOTRIN) 600 MG tablet Take 1 tablet (600 mg total) by mouth every 8 (eight) hours as needed. 04/11/18   Merrily Brittle, MD  Insulin Glargine (TOUJEO SOLOSTAR) 300 UNIT/ML SOPN Inject 10 Units into the skin daily.    [provider]  metFORMIN (GLUMETZA) 500 MG (MOD) 24 hr tablet Take 500 mg by mouth 2 (two) times daily with a meal.    [provider]  MILK THISTLE PO Take 1 capsule by mouth daily.    [provider]  Multiple Vitamins-Minerals (WOMENS MULTI PO) Take 1 tablet by mouth daily.    [provider]  ondansetron (ZOFRAN ODT) 4 MG disintegrating tablet Take 1 tablet (4 mg total) by mouth every 8 (eight) hours as needed for nausea or vomiting. 04/11/18   Merrily Brittle, MD  POTASSIUM GLUCONATE PO Take 1 tablet by mouth daily.    [provider]    Allergies Demerol [meperidine] and Milk-related compounds  Family History  Problem Relation Age of Onset  . Diabetes Sister   . Diabetes Sister     Social History Social History   Tobacco Use  . Smoking status:  Current Every Day Smoker    Packs/day: 0.25    Years: 10.00    Pack years: 2.50    Types: Cigarettes  . Smokeless tobacco: Never Used  Substance Use Topics  . Alcohol use: Yes    Comment: occasionally - holidays  . Drug use: Not on file    Review of Systems Constitutional: No fever/chills Eyes: No visual changes. ENT: No sore throat. Cardiovascular: Denies chest pain. Respiratory: Denies shortness of breath. Gastrointestinal: Left flank pain radiating to the left lower quadrant.  Some nausea and an episode of emesis earlier today. no diarrhea.  No constipation. Genitourinary: Negative for dysuria. Musculoskeletal: Negative for neck pain.  Left flank pain as described above Integumentary: Negative for rash. Neurological: Negative for headaches, focal weakness or numbness.   ____________________________________________   PHYSICAL EXAM:  VITAL SIGNS: ED Triage Vitals [04/16/18 1748]  Enc Vitals Group     BP 118/76     Pulse Rate (!) 102     Resp 18     Temp 98.2 F (36.8 C)     Temp Source Oral     SpO2 95 %     Weight 72.6 kg (160 lb)     Height 1.575 m (5\' 2" )     Head Circumference      Peak Flow      Pain Score 8     Pain Loc      Pain Edu?      Excl. in GC?     Constitutional: Alert and oriented.  Patient appears uncomfortable and is crying. Eyes: Conjunctivae are normal.  Head: Atraumatic. Nose: No congestion/rhinnorhea. Mouth/Throat: Mucous membranes are moist. Neck: No stridor.  No meningeal signs.   Cardiovascular: Normal rate, regular rhythm. Good peripheral circulation. Grossly normal heart sounds. Respiratory: Normal respiratory effort.  No retractions. Lungs CTAB. Gastrointestinal: Left-sided abdominal tenderness that seems to be coming from the left flank.  Left CVA tenderness to percussion. Musculoskeletal: No lower extremity tenderness nor edema. No gross deformities of extremities. Neurologic:  Normal speech and language. No gross focal  neurologic deficits are appreciated.  Skin:  Skin is warm, dry and intact. No rash noted. Psychiatric: Patient is tearful and crying but able to communicate fully.  ____________________________________________   LABS (all labs ordered are listed, but only abnormal results are displayed)  Labs Reviewed  COMPREHENSIVE METABOLIC PANEL - Abnormal; Notable for the following components:      Result Value   Glucose, Bld 307 (*)    All other components within normal limits  URINALYSIS, COMPLETE (UACMP) WITH MICROSCOPIC - Abnormal; Notable for the following components:   Color, Urine AMBER (*)    APPearance CLOUDY (*)    Specific Gravity, Urine 1.031 (*)    Glucose, UA >=  500 (*)    Bilirubin Urine SMALL (*)    Ketones, ur 5 (*)    Protein, ur 100 (*)    Leukocytes, UA SMALL (*)    WBC, UA >50 (*)    Bacteria, UA RARE (*)    All other components within normal limits  URINE CULTURE  LIPASE, BLOOD  CBC   ____________________________________________  EKG  None - EKG not ordered by ED physician ____________________________________________  RADIOLOGY Marylou MccoyI, Kada Friesen, personally viewed and evaluated these images (plain radiographs) as part of my medical decision making, as well as reviewing the written report by the radiologist.  ED MD interpretation: No acute abnormalities found on CT scan with IV contrast including no evidence of pyelonephritis or ureteral stones.  Official radiology report(s): Ct Abdomen Pelvis W Contrast  Result Date: 04/16/2018 CLINICAL DATA:  Left lower quadrant abdominal pain.  Fever. EXAM: CT ABDOMEN AND PELVIS WITH CONTRAST TECHNIQUE: Multidetector CT imaging of the abdomen and pelvis was performed using the standard protocol following bolus administration of intravenous contrast. CONTRAST:  100mL OMNIPAQUE IOHEXOL 300 MG/ML  SOLN COMPARISON:  CT abdomen pelvis 04/11/2018 FINDINGS: LOWER CHEST: No basilar pulmonary nodules or pleural effusion. No apical  pericardial effusion. HEPATOBILIARY: Normal hepatic contours and density. No intra- or extrahepatic biliary dilatation. Normal gallbladder. PANCREAS: Normal parenchymal contours without ductal dilatation. No peripancreatic fluid collection. SPLEEN: Normal. ADRENALS/URINARY TRACT: --Adrenal glands: Normal. --Right kidney/ureter: No hydronephrosis, nephroureterolithiasis, perinephric stranding or solid renal mass. --Left kidney/ureter: No hydronephrosis, nephroureterolithiasis, perinephric stranding or solid renal mass. --Urinary bladder: Normal for degree of distention STOMACH/BOWEL: --Stomach/Duodenum: No hiatal hernia or other gastric abnormality. Normal duodenal course. --Small bowel: No dilatation or inflammation. --Colon: No focal abnormality. --Appendix: Normal. VASCULAR/LYMPHATIC: Atherosclerotic calcification is present within the non-aneurysmal abdominal aorta, without hemodynamically significant stenosis. No abdominal or pelvic lymphadenopathy. REPRODUCTIVE: No free fluid in the pelvis. MUSCULOSKELETAL. No bony spinal canal stenosis or focal osseous abnormality. OTHER: None. IMPRESSION: 1. No acute abnormality of the abdomen or pelvis. 2. Mild calcific aortic atherosclerosis (ICD10-I70.0). Electronically Signed   By: Deatra RobinsonKevin  Herman M.D.   On: 04/16/2018 20:49    ____________________________________________   PROCEDURES  Critical Care performed: No   Procedure(s) performed:   Procedures   ____________________________________________   INITIAL IMPRESSION / ASSESSMENT AND PLAN / ED COURSE  As part of my medical decision making, I reviewed the following data within the electronic MEDICAL RECORD NUMBER Nursing notes reviewed and incorporated, Labs reviewed , Old chart reviewed, Notes from prior ED visits and [place] Controlled Substance Database    Differential diagnosis includes, but is not limited to, pyelonephritis/UTI, renal/ureteral colic, renal infarction, musculoskeletal pain/strain, less  likely biliary colic, diverticulitis, or other acute intra-abdominal infection.  The patient had a reassuring renal protocol CT during her last ED visit but now she has a urinary tract infection that was not present previously.  I will obtain a CT scan with IV contrast to evaluate for any evidence of pyelonephritis as well as to look and make sure there is no evidence of renal calculi.  Her conference of metabolic panel is within normal limits with normal kidney function and she has no leukocytosis which is reassuring.  The initial tachycardia which was borderline  resolved after she received morphine 4 mg IV and Zofran 4 mg IV.  Urine culture has been sent.  Ceftriaxone 1 g IV for empiric treatment of UTI/Pyelo.  I will reassess after the CT scan.   Clinical Course as of Apr 16 2254  Wed Apr 16, 2018  2142 Work-up is reassuring, the CT scan shows no acute abnormalities, the only thing notable on her medical work-up was the positive urinary tract infection.  I will treat empirically for pyelonephritis even though there is no evidence of pyonephritis on the CT scan.  I gave the patient my usual customary return precautions.  I checked the drug database and she has no recent prescriptions listed although I believe that she had a prescription for hydrocodone after the recent bout of what was presumed to be renal colic; I will refill that prescription but explained that any additional medications need to come from her primary care provider.  She states that she understands and agrees with the plan.  CT ABDOMEN PELVIS W CONTRAST [CF]    Clinical Course User Index [CF] Loleta Rose, MD    ____________________________________________  FINAL CLINICAL IMPRESSION(S) / ED DIAGNOSES  Final diagnoses:  Urinary tract infection without hematuria, site unspecified  Left flank pain     MEDICATIONS GIVEN DURING THIS VISIT:  Medications  morphine 4 MG/ML injection 4 mg (4 mg Intravenous Given 04/16/18 2015)    ondansetron (ZOFRAN) injection 4 mg (4 mg Intravenous Given 04/16/18 2014)  sodium chloride 0.9 % bolus 1,000 mL (0 mLs Intravenous Stopped 04/16/18 2151)  iohexol (OMNIPAQUE) 300 MG/ML solution 100 mL (100 mLs Intravenous Contrast Given 04/16/18 2029)  cefTRIAXone (ROCEPHIN) 1 g in sodium chloride 0.9 % 100 mL IVPB (0 g Intravenous Stopped 04/16/18 2151)  ketorolac (TORADOL) 30 MG/ML injection 15 mg (15 mg Intravenous Given 04/16/18 2152)  oxyCODONE-acetaminophen (PERCOCET/ROXICET) 5-325 MG per tablet 2 tablet (2 tablets Oral Given 04/16/18 2152)     ED Discharge Orders        Ordered    cephALEXin (KEFLEX) 500 MG capsule  3 times daily     04/16/18 2144    HYDROcodone-acetaminophen (NORCO) 5-325 MG tablet  Every 6 hours PRN     04/16/18 2144    docusate sodium (COLACE) 100 MG capsule     04/16/18 2144    fluconazole (DIFLUCAN) 150 MG tablet   Once     04/16/18 2201       Note:  This document was prepared using Dragon voice recognition software and may include unintentional dictation errors.    Loleta Rose, MD 04/16/18 2255

## 2018-04-16 NOTE — ED Triage Notes (Signed)
Pt presents to ED via POV with c/o LLQ abdominal pain that radiates into her lower back. Pt states pain so bad it made her throw up. Pt also c/o fever this morning, states took Hydrocodone this morning with little relief from pain. Pt c/o burning with urination and increased frequency.

## 2018-04-19 LAB — URINE CULTURE
Culture: >=100000 — AB
Special Requests: NORMAL

## 2019-04-16 IMAGING — CT CT ABD-PELV W/ CM
2 of 5 series · 16 of 46 positions shown, 18 images · IV contrast (APPLIED)
Comparison: CT abdomen pelvis 04/11/2018

CLINICAL DATA: Left lower quadrant abdominal pain.  Fever.

EXAM:
CT ABDOMEN AND PELVIS WITH CONTRAST
TECHNIQUE: Multidetector CT imaging of the abdomen and pelvis was performed
using the standard protocol following bolus administration of
intravenous contrast.
CONTRAST:  100mL OMNIPAQUE IOHEXOL 300 MG/ML  SOLN

[Series 2: axial st · axial · 0.77mm/px · z∈[-938,-518]mm · 13 of 94 slices shown, 15 images]
[im 5/94  soft-tissue]
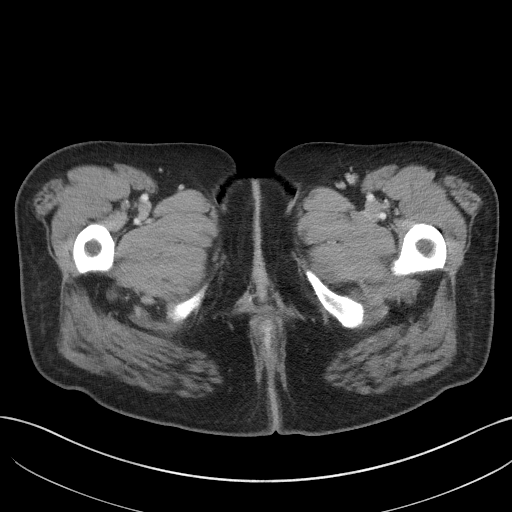
[im 5/94  bone]
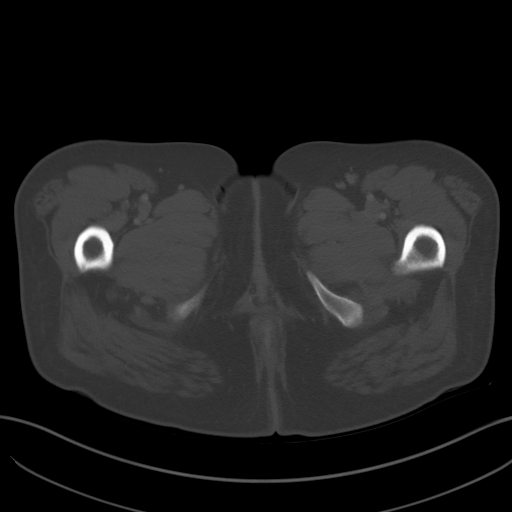
[im 14/94  soft-tissue]
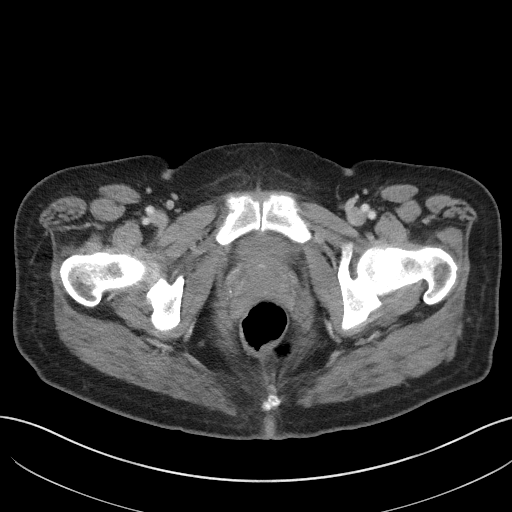
[im 19/94  soft-tissue]
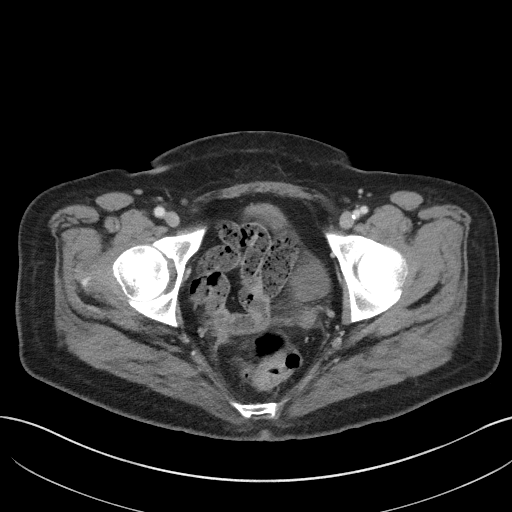
[im 28/94  soft-tissue]
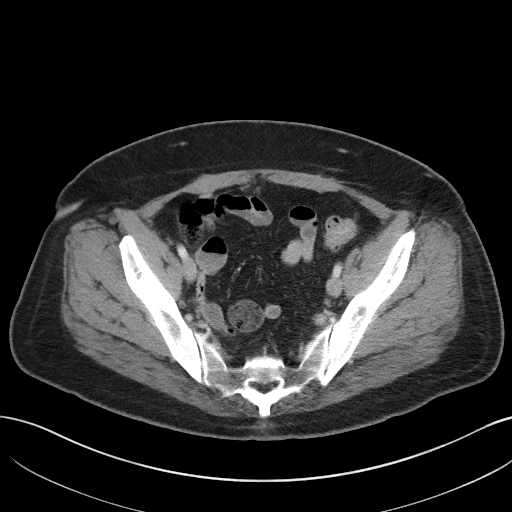
[im 33/94  soft-tissue]
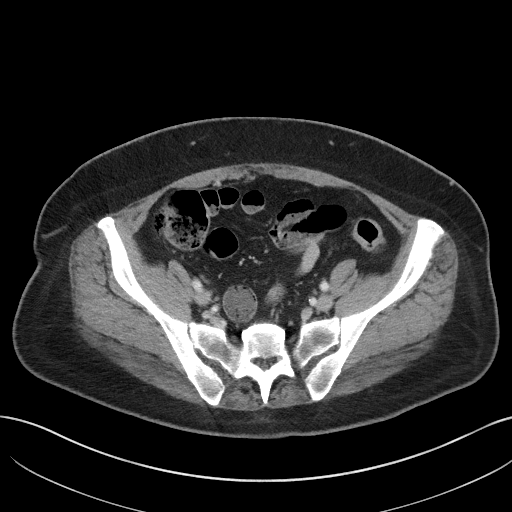
[im 42/94  soft-tissue]
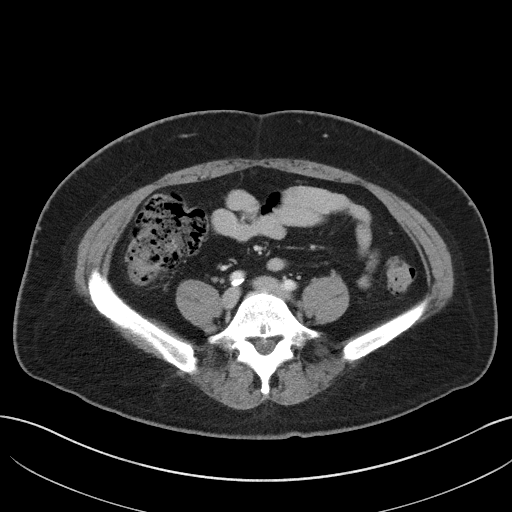
[im 47/94  soft-tissue]
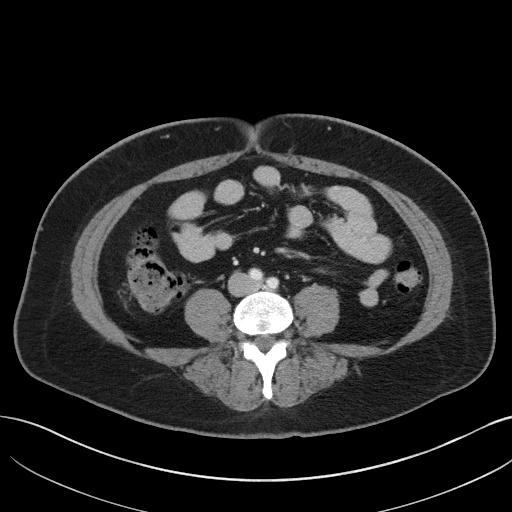
[im 52/94  soft-tissue]
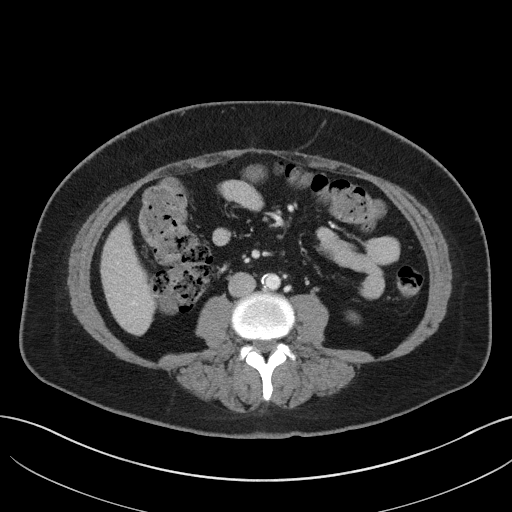
[im 61/94  soft-tissue]
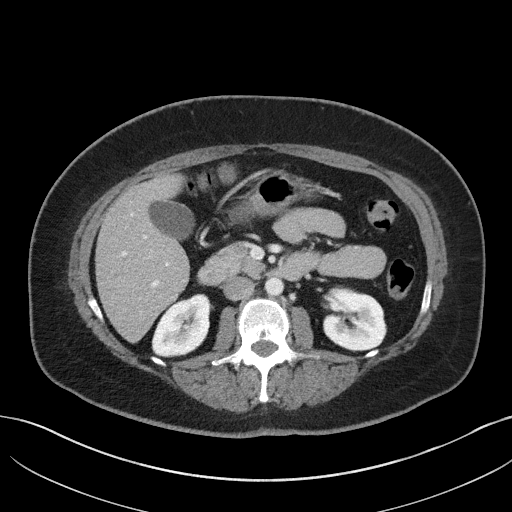
[im 61/94  bone]
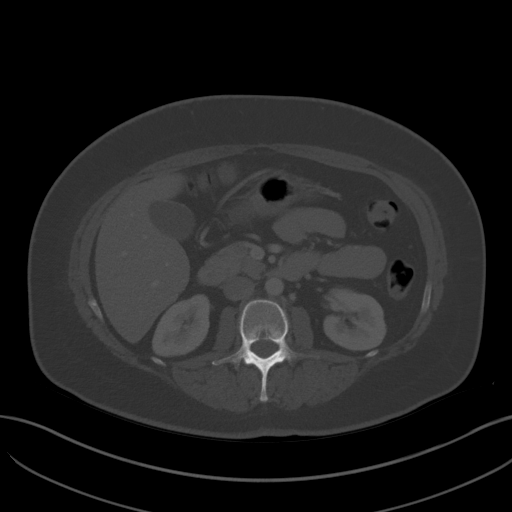
[im 66/94  soft-tissue]
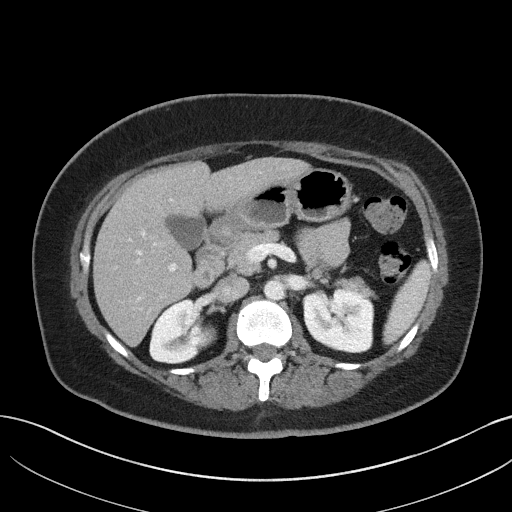
[im 75/94  soft-tissue]
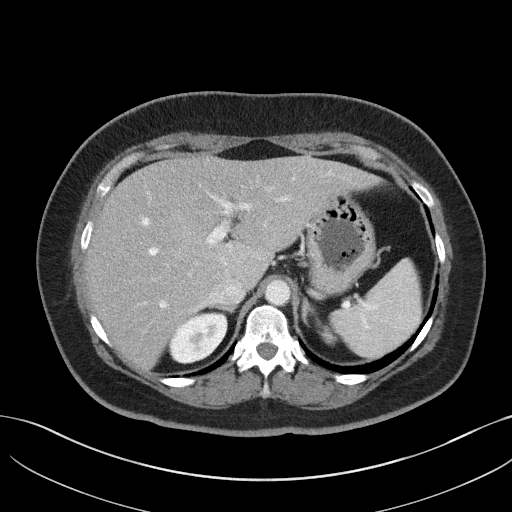
[im 80/94  soft-tissue]
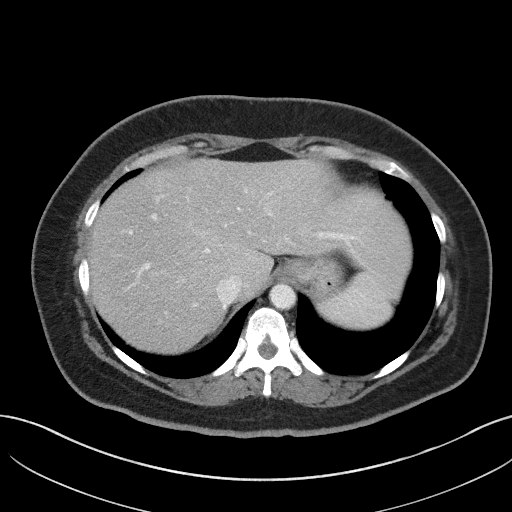
[im 89/94  soft-tissue]
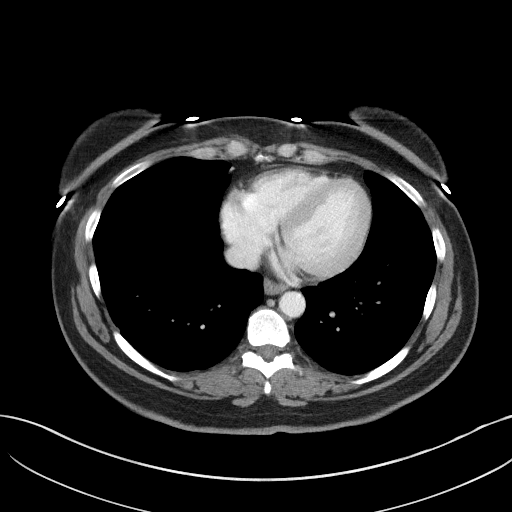

[Series 5: coronal st · coronal · 0.74mm/px · 3 of 84 slices shown]
[im 28/84  soft-tissue]
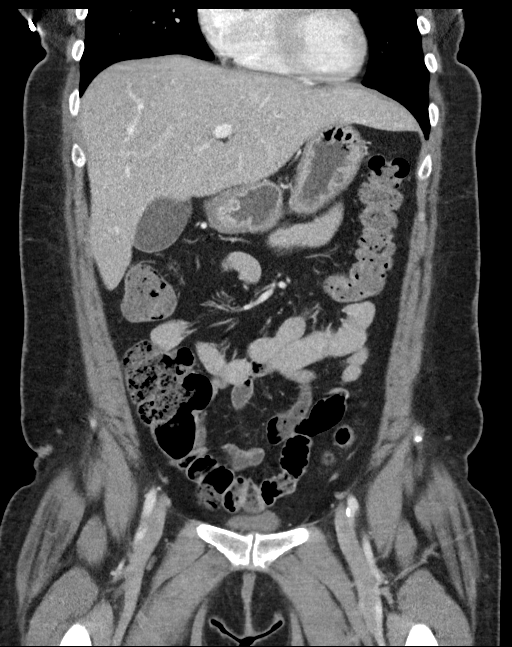
[im 37/84  soft-tissue]
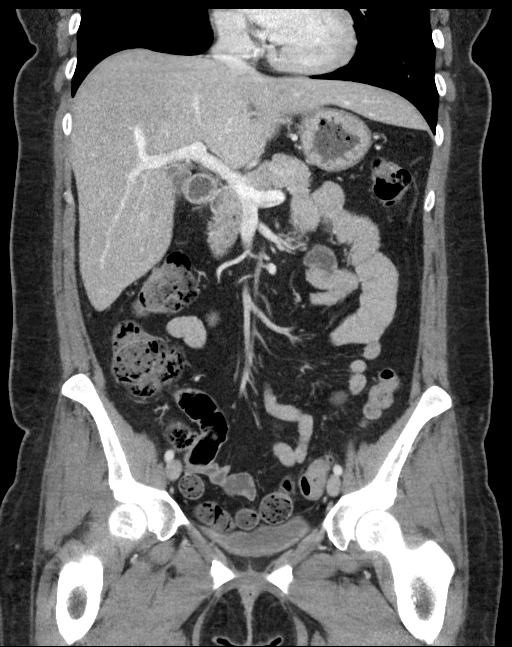
[im 47/84  soft-tissue]
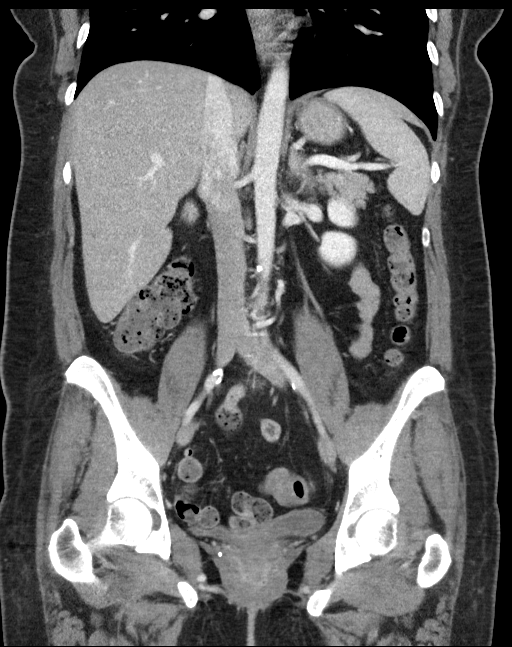

[16 of 46 positions shown; findings below may reference images not displayed]

FINDINGS: LOWER CHEST: No basilar pulmonary nodules or pleural effusion. No
apical pericardial effusion.

HEPATOBILIARY: Normal hepatic contours and density. No intra- or
extrahepatic biliary dilatation. Normal gallbladder.

PANCREAS: Normal parenchymal contours without ductal dilatation. No
peripancreatic fluid collection.

SPLEEN: Normal.

ADRENALS/URINARY TRACT:

--Adrenal glands: Normal.

--Right kidney/ureter: No hydronephrosis, nephroureterolithiasis,
perinephric stranding or solid renal mass.

--Left kidney/ureter: No hydronephrosis, nephroureterolithiasis,
perinephric stranding or solid renal mass.

--Urinary bladder: Normal for degree of distention

STOMACH/BOWEL:

--Stomach/Duodenum: No hiatal hernia or other gastric abnormality.
Normal duodenal course.

--Small bowel: No dilatation or inflammation.

--Colon: No focal abnormality.

--Appendix: Normal.

VASCULAR/LYMPHATIC: Atherosclerotic calcification is present within
the non-aneurysmal abdominal aorta, without hemodynamically
significant stenosis. No abdominal or pelvic lymphadenopathy.

REPRODUCTIVE: No free fluid in the pelvis.

MUSCULOSKELETAL. No bony spinal canal stenosis or focal osseous
abnormality.

OTHER: None.
IMPRESSION: 1. No acute abnormality of the abdomen or pelvis.
2. Mild calcific aortic atherosclerosis ([id number]).
